# Patient Record
Sex: Female | Born: 1940 | Race: Black or African American | Hispanic: No | Smoking: Never smoker
Health system: Southern US, Community
[De-identification: ages and names within clinical notes are randomized; demographics above are authoritative.]

## PROBLEM LIST (undated history)

## (undated) DIAGNOSIS — I10 Essential (primary) hypertension: Secondary | ICD-10-CM

## (undated) DIAGNOSIS — F329 Major depressive disorder, single episode, unspecified: Secondary | ICD-10-CM

## (undated) DIAGNOSIS — F32A Depression, unspecified: Secondary | ICD-10-CM

---

## 2016-10-22 ENCOUNTER — Emergency Department (HOSPITAL_COMMUNITY): Payer: Medicare Other

## 2016-10-22 ENCOUNTER — Encounter (HOSPITAL_COMMUNITY): Payer: Self-pay | Admitting: Emergency Medicine

## 2016-10-22 ENCOUNTER — Emergency Department (HOSPITAL_COMMUNITY)
Admission: EM | Admit: 2016-10-22 | Discharge: 2016-10-22 | Disposition: A | Discharge status: Home / Self Care | Payer: Medicare Other | Attending: Emergency Medicine | Admitting: Emergency Medicine

## 2016-10-22 DIAGNOSIS — M545 Low back pain, unspecified: Secondary | ICD-10-CM

## 2016-10-22 DIAGNOSIS — M549 Dorsalgia, unspecified: Secondary | ICD-10-CM

## 2016-10-22 DIAGNOSIS — I10 Essential (primary) hypertension: Secondary | ICD-10-CM | POA: Diagnosis not present

## 2016-10-22 HISTORY — DX: Depression, unspecified: F32.A

## 2016-10-22 HISTORY — DX: Major depressive disorder, single episode, unspecified: F32.9

## 2016-10-22 HISTORY — DX: Essential (primary) hypertension: I10

## 2016-10-22 MED ORDER — KETOROLAC TROMETHAMINE 60 MG/2ML IM SOLN
15.0000 mg | Freq: Once | INTRAMUSCULAR | Status: DC
  Filled 2016-10-22: qty 2

## 2016-10-22 MED ORDER — DIAZEPAM 2 MG PO TABS
2.0000 mg | ORAL_TABLET | Freq: Once | ORAL | Status: DC
  Filled 2016-10-22: qty 1

## 2016-10-22 MED ORDER — ACETAMINOPHEN 500 MG PO TABS
1000.0000 mg | ORAL_TABLET | Freq: Once | ORAL | Status: AC
  Administered 2016-10-22: 1000 mg via ORAL
  Filled 2016-10-22: qty 2

## 2016-10-22 MED ORDER — OXYCODONE HCL 5 MG PO TABS
2.5000 mg | ORAL_TABLET | Freq: Once | ORAL | Status: DC
  Filled 2016-10-22: qty 1

## 2016-10-22 NOTE — ED Triage Notes (Signed)
Pt. Stated, I started having lower back pain on Friday  For no reason or Im not sure.

## 2016-10-22 NOTE — Discharge Instructions (Signed)
Take tylenol 1000mg(2 extra strength) four times a day.  ° °

## 2016-10-22 NOTE — ED Notes (Signed)
Placed patient into room patient is resting with family at bedside 

## 2016-10-22 NOTE — ED Provider Notes (Signed)
[facility]-EMERGENCY DEPT Provider Note   CSN: [id number] Arrival date & time: 10/22/16  1131     History   Chief Complaint Chief Complaint  Patient presents with  . Back Pain    HPI Amanda Cooke is a 76 y.o. female.  76 yo F with a cc of R sided low back pain. This been going on for the past 4 days. She denies any injury. She has been taking care of a toddler. Denies radiation of the pain. Denies dysuria or increased frequency or hesitancy. Denies blood in her urine. Denies pain like this previously. Denies history of cancer. Denies numbness or weakness to her leg. Denies loss of bowel or bladder. Denies loss of perirectal sensation. Pain is sharp. Worse with movement and palpation.   The history is provided by the patient.  Back Pain   This is a new problem. The current episode started more than 2 days ago. The problem occurs constantly. The problem has been gradually worsening. The pain is associated with no known injury. The pain is present in the lumbar spine. The quality of the pain is described as stabbing and shooting. The pain does not radiate. The pain is at a severity of 9/10. The pain is moderate. Pertinent negatives include no chest pain, no fever, no headaches and no dysuria. She has tried nothing for the symptoms.    Past Medical History:  Diagnosis Date  . Depression   . Hypertension     There are no active problems to display for this patient.   History reviewed. No pertinent surgical history.  OB History    No data available       Home Medications    Prior to Admission medications   Not on File    Family History No family history on file.  Social History Social History  Substance Use Topics  . Smoking status: Never Smoker  . Smokeless tobacco: Never Used  . Alcohol use No     Allergies   Patient has no allergy information on record.   Review of Systems Review of Systems  Constitutional: Negative for chills and fever.  HENT: Negative  for congestion and rhinorrhea.   Eyes: Negative for redness and visual disturbance.  Respiratory: Negative for shortness of breath and wheezing.   Cardiovascular: Negative for chest pain and palpitations.  Gastrointestinal: Negative for nausea and vomiting.  Genitourinary: Negative for dysuria and urgency.  Musculoskeletal: Positive for back pain. Negative for arthralgias and myalgias.  Skin: Negative for pallor and wound.  Neurological: Negative for dizziness and headaches.     Physical Exam Updated Vital Signs BP (!) 145/66 (BP Location: Right Arm)   Pulse (!) 52   Temp 98.7 F (37.1 C) (Oral)   Resp 16   Ht 5\' 11"  (1.803 m)   Wt 83.5 kg (184 lb)   SpO2 99%   BMI 25.66 kg/m   Physical Exam  Constitutional: She is oriented to person, place, and time. She appears well-developed and well-nourished. No distress.  HENT:  Head: Normocephalic and atraumatic.  Eyes: Pupils are equal, round, and reactive to light. EOM are normal.  Neck: Normal range of motion. Neck supple.  Cardiovascular: Normal rate and regular rhythm.  Exam reveals no gallop and no friction rub.   No murmur heard. Pulmonary/Chest: Effort normal. She has no wheezes. She has no rales.  Abdominal: Soft. She exhibits no distension and no mass. There is no tenderness. There is no guarding.  Musculoskeletal: She exhibits no edema  or tenderness.  Neurological: She is alert and oriented to person, place, and time.  Skin: Skin is warm and dry. She is not diaphoretic.  Psychiatric: She has a normal mood and affect. Her behavior is normal.  Nursing note and vitals reviewed.    ED Treatments / Results  Labs (all labs ordered are listed, but only abnormal results are displayed) Labs Reviewed - No data to display  EKG  EKG Interpretation None       Radiology Ct L-spine No Charge  Result Date: 10/22/2016 CLINICAL DATA:  Right-sided low back pain beginning 3 days ago. EXAM: CT LUMBAR SPINE WITHOUT CONTRAST  TECHNIQUE: Multidetector CT imaging of the lumbar spine was performed without intravenous contrast administration. Multiplanar CT image reconstructions were also generated. COMPARISON:  None. FINDINGS: Segmentation: Normal. Alignment: Trace retrolisthesis of L2 on L3. Mild straightening of the normal lumbar lordosis. Vertebrae: Preserved vertebral body heights without evidence of fracture or destructive osseous process. Paraspinal and other soft tissues: Abdominal aortic atherosclerosis and colonic diverticulosis. Disc levels: There is advanced disc degeneration at L1-2 with complete disc space height loss and marked degenerative endplate sclerosis and irregularity with mild vacuum disc phenomenon. A partially calcified central disc extrusion at L1-2 results in at least moderate spinal stenosis, with disc bulging and facet arthrosis resulting in mild left greater than right neural foraminal stenosis. Milder disc degeneration is present throughout the remainder of the lumbar spine with disc bulging and posterior element hypertrophy resulting in likely mild spinal stenosis at L2-3, L3-4, and L4-5. There is mild neural foraminal stenosis at these levels, greater on the left. Limited visualization of the lower thoracic spine demonstrates advanced facet arthrosis at T11-12 and T12-L1. IMPRESSION: 1. Severe L1-2 disc degeneration with moderate spinal stenosis. 2. Mild multilevel spinal and neural foraminal stenosis elsewhere as above. Electronically Signed   By: Sebastian AcheAllen  Grady M.D.   On: 10/22/2016 14:55   Ct Renal Stone Study  Result Date: 10/22/2016 CLINICAL DATA:  Right-sided low back pain beginning 3 days ago. EXAM: CT ABDOMEN AND PELVIS WITHOUT CONTRAST TECHNIQUE: Multidetector CT imaging of the abdomen and pelvis was performed following the standard protocol without IV contrast. COMPARISON:  None. FINDINGS: Lower chest: Minimal dependent atelectasis in the lung bases. Hepatobiliary: No focal liver abnormality is  seen. There is a small amount of dependent hyperattenuating material in the gallbladder which may reflect tiny stones. No pericholecystic inflammation is evident. There is no biliary dilatation. Pancreas: Unremarkable. Spleen: Unremarkable. Adrenals/Urinary Tract: Unremarkable adrenal glands. No evidence renal mass, calculi, or hydronephrosis. Unremarkable bladder. Stomach/Bowel: The stomach is within normal limits. There is no evidence of bowel obstruction. Relatively diffuse colonic diverticulosis is noted without evidence of diverticulitis. The appendix is unremarkable. Vascular/Lymphatic: Tortuous, calcified infrarenal abdominal aorta without aneurysm. No enlarged lymph nodes. Reproductive: Partially calcified posterior uterine mass measuring approximately 4.7 x 3.9 cm. 1.7 cm low-density focus centrally at the level of the lower uterus, cervix may reflect a cyst or small amount of fluid. Unremarkable ovaries. Other: No intraperitoneal free fluid. No abdominal wall mass or hernia. Musculoskeletal: Thoracolumbar spondylosis, reported on separate lumbar spine CT. IMPRESSION: 1. No acute abnormality identified in the abdomen or pelvis. 2. 4.7 cm uterine mass, likely a fibroid. 3. Suspected tiny gallstones. 4. Colonic diverticulosis. 5.  Aortic Atherosclerosis (ICD10-I70.0). Electronically Signed   By: Sebastian AcheAllen  Grady M.D.   On: 10/22/2016 14:49    Procedures Procedures (including critical care time)  Medications Ordered in ED Medications  oxyCODONE (Oxy IR/ROXICODONE) immediate release  tablet 2.5 mg (2.5 mg Oral Refused 10/22/16 1415)  ketorolac (TORADOL) injection 15 mg (15 mg Intramuscular Refused 10/22/16 1415)  diazepam (VALIUM) tablet 2 mg (2 mg Oral Refused 10/22/16 1415)  acetaminophen (TYLENOL) tablet 1,000 mg (1,000 mg Oral Given 10/22/16 1412)     Initial Impression / Assessment and Plan / ED Course  I have reviewed the triage vital signs and the nursing notes.  Pertinent labs & imaging results  that were available during my care of the patient were reviewed by me and considered in my medical decision making (see chart for details).     76 yo F With a chief complaint of right-sided low back pain. There are no red flags except for the patient's age. Will obtain imaging to further evaluate the size of the aorta as well as other possible pathology.  CT negative for AAA or other concerning pathology. She does have a possible uterine fibroid as well as there is some chronic lumbar disease concerning for possible herniation. I discussed results with the patient. Will have her take Tylenol around-the-clock. Follow with her family physician.  4:14 PM:  I have discussed the diagnosis/risks/treatment options with the patient and family and believe the pt to be eligible for discharge home to follow-up with PCP. We also discussed returning to the ED immediately if new or worsening sx occur. We discussed the sx which are most concerning (e.g., sudden worsening pain, fever, inability to tolerate by mouth) that necessitate immediate return. Medications administered to the patient during their visit and any new prescriptions provided to the patient are listed below.  Medications given during this visit Medications  oxyCODONE (Oxy IR/ROXICODONE) immediate release tablet 2.5 mg (2.5 mg Oral Refused 10/22/16 1415)  ketorolac (TORADOL) injection 15 mg (15 mg Intramuscular Refused 10/22/16 1415)  diazepam (VALIUM) tablet 2 mg (2 mg Oral Refused 10/22/16 1415)  acetaminophen (TYLENOL) tablet 1,000 mg (1,000 mg Oral Given 10/22/16 1412)     The patient appears reasonably screen and/or stabilized for discharge and I doubt any other medical condition or other [facility] requiring further screening, evaluation, or treatment in the ED at this time prior to discharge.    Final Clinical Impressions(s) / ED Diagnoses   Final diagnoses:  Acute right-sided low back pain without sciatica    New Prescriptions There are no  discharge medications for this patient.    Melene PlanFloyd, Selby Foisy, DO 10/22/16 1614

## 2016-10-22 NOTE — ED Notes (Signed)
PT refused all medications other than tylenol. Then when taking tylenol refused to take both tablets and only took 500mg 

## 2019-02-19 IMAGING — CT CT RENAL STONE PROTOCOL
2 of 4 series · 15 of 46 positions shown, 17 images · non-contrast
Comparison: None.

CLINICAL DATA: Right-sided low back pain beginning 3 days ago.

EXAM:
CT ABDOMEN AND PELVIS WITHOUT CONTRAST
TECHNIQUE: Multidetector CT imaging of the abdomen and pelvis was performed
following the standard protocol without IV contrast.

[Series 3: stone study 5.0 i30f 2 · axial · 0.68mm/px · z∈[+1068,+1473]mm · 12 of 93 slices shown, 14 images]
[im 8/93  soft-tissue]
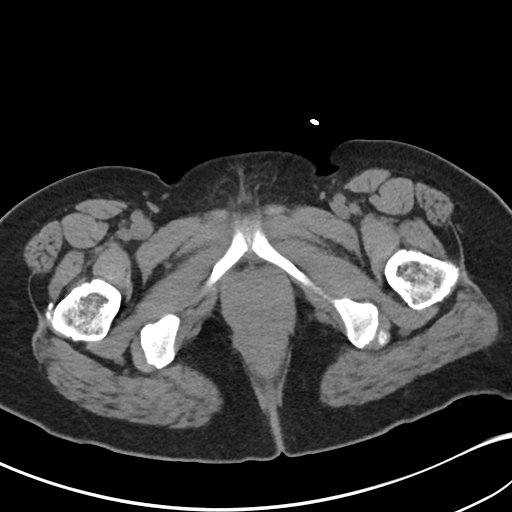
[im 8/93  bone]
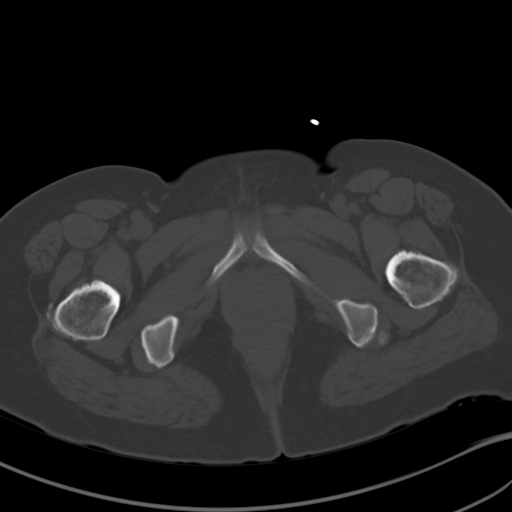
[im 15/93  soft-tissue]
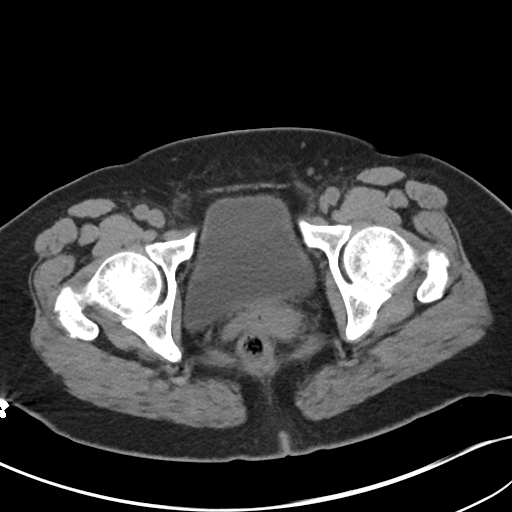
[im 23/93  soft-tissue]
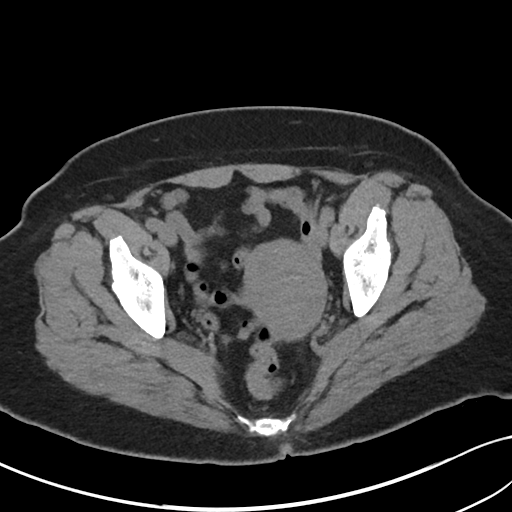
[im 30/93  soft-tissue]
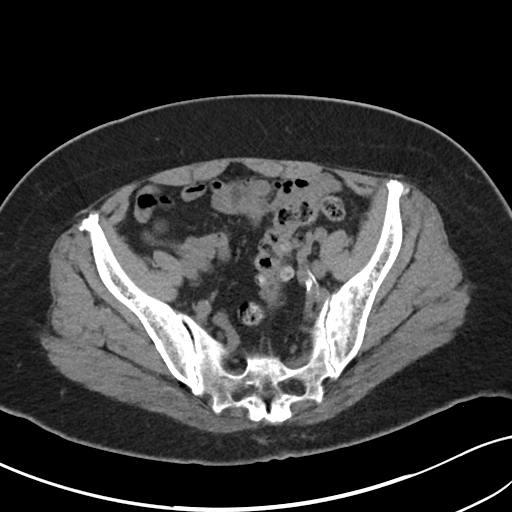
[im 37/93  soft-tissue]
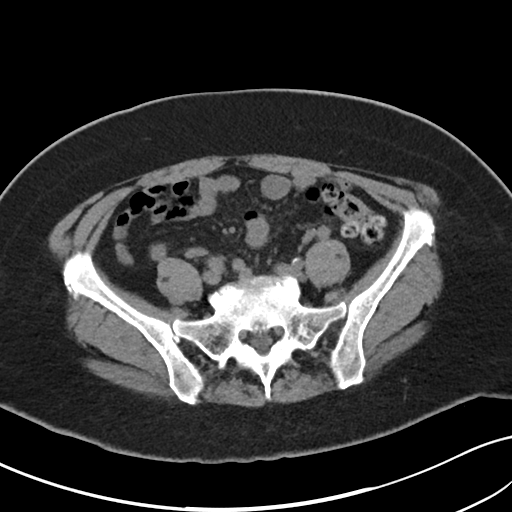
[im 45/93  soft-tissue]
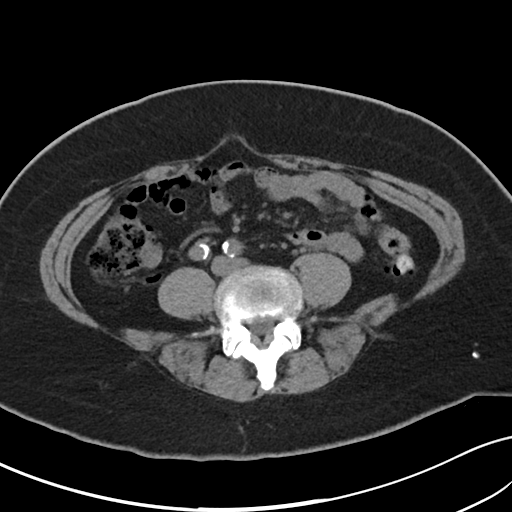
[im 52/93  soft-tissue]
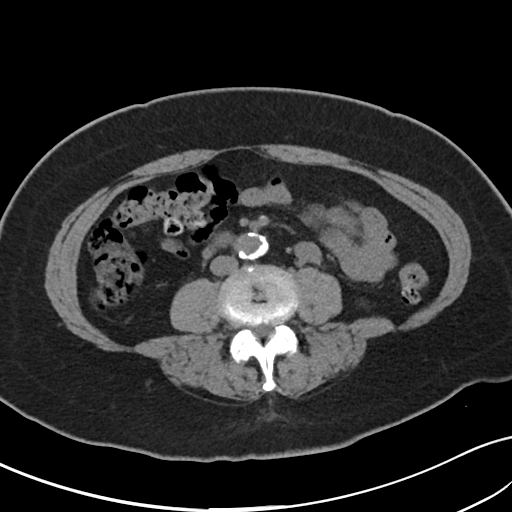
[im 59/93  soft-tissue]
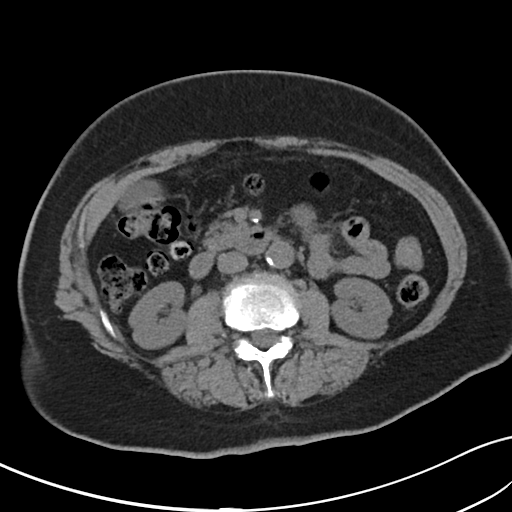
[im 67/93  soft-tissue]
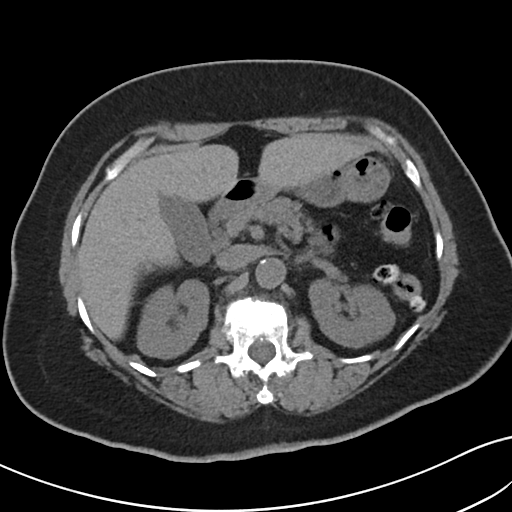
[im 67/93  bone]
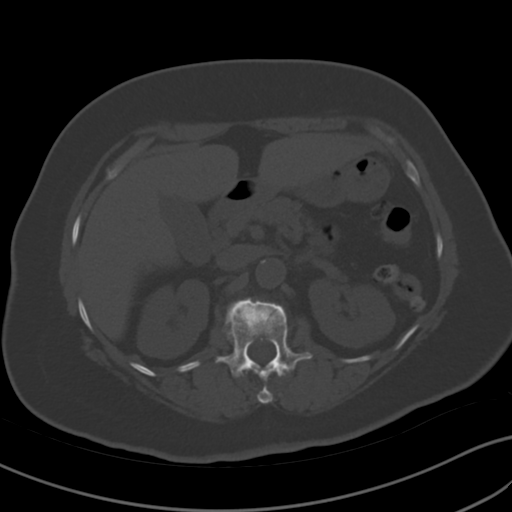
[im 74/93  soft-tissue]
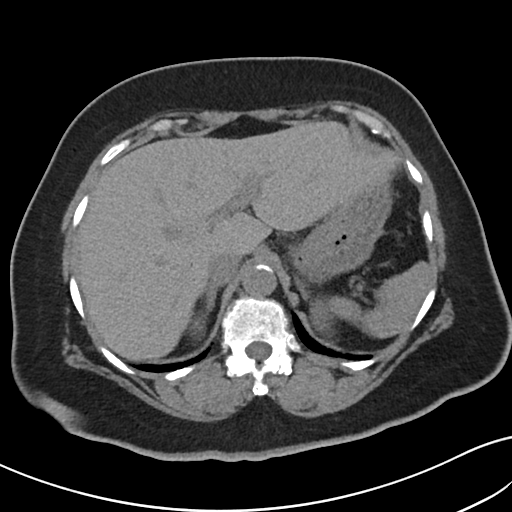
[im 81/93  soft-tissue]
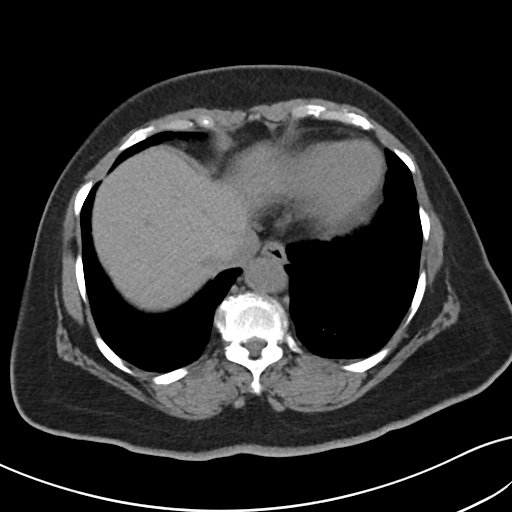
[im 89/93  soft-tissue]
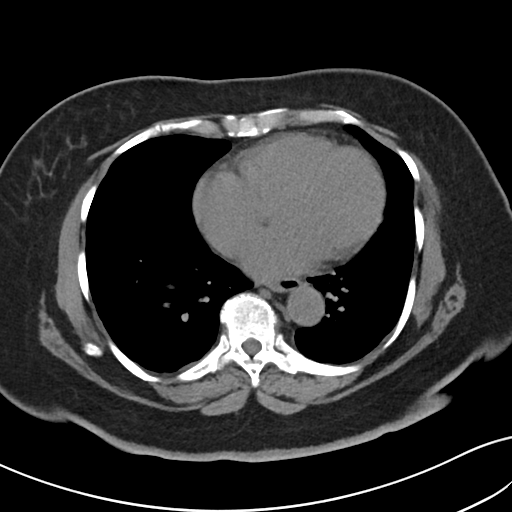

[Series 6: coronal soft tissue · coronal · 0.62mm/px · 3 of 101 slices shown]
[im 34/101  soft-tissue]
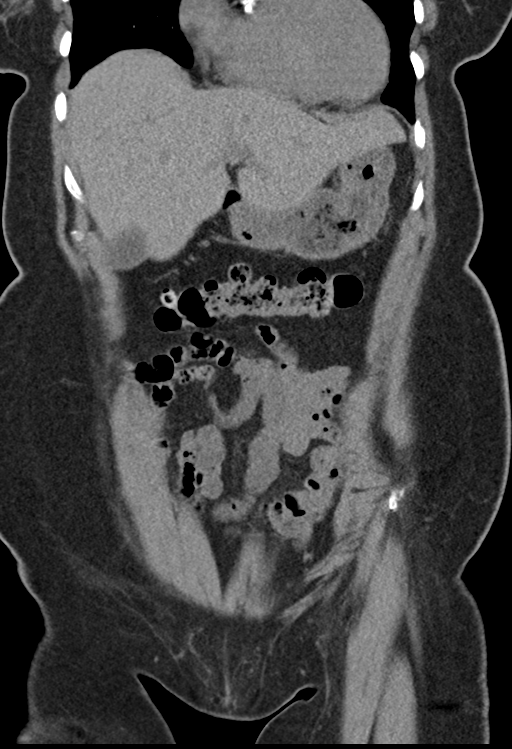
[im 45/101  soft-tissue]
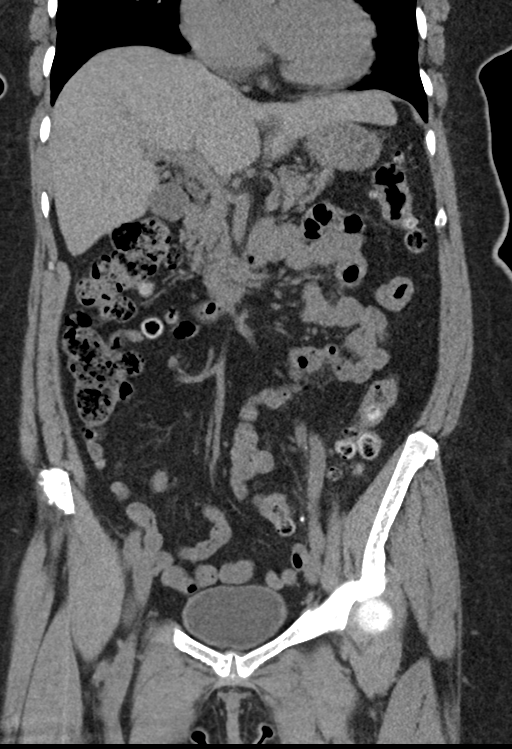
[im 56/101  soft-tissue]
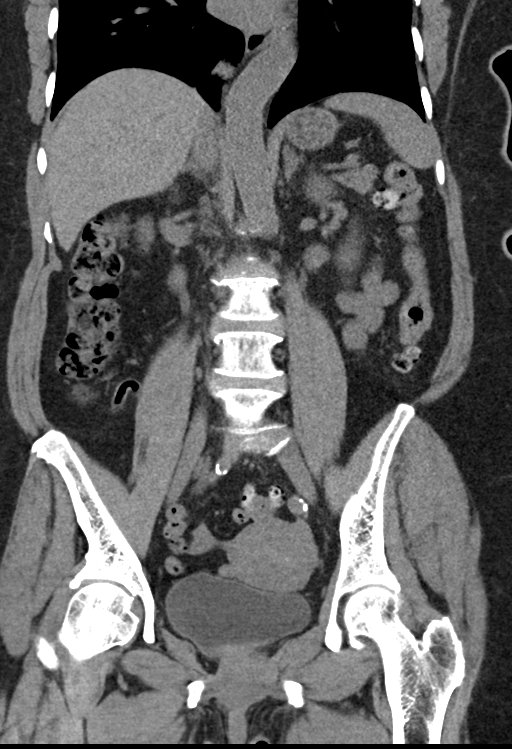

[15 of 46 positions shown; findings below may reference images not displayed]

FINDINGS: Lower chest: Minimal dependent atelectasis in the lung bases.

Hepatobiliary: No focal liver abnormality is seen. There is a small
amount of dependent hyperattenuating material in the gallbladder
which may reflect tiny stones. No pericholecystic inflammation is
evident. There is no biliary dilatation.

Pancreas: Unremarkable.

Spleen: Unremarkable.

Adrenals/Urinary Tract: Unremarkable adrenal glands. No evidence
renal mass, calculi, or hydronephrosis. Unremarkable bladder.

Stomach/Bowel: The stomach is within normal limits. There is no
evidence of bowel obstruction. Relatively diffuse colonic
diverticulosis is noted without evidence of diverticulitis. The
appendix is unremarkable.

Vascular/Lymphatic: Tortuous, calcified infrarenal abdominal aorta
without aneurysm. No enlarged lymph nodes.

Reproductive: Partially calcified posterior uterine mass measuring
approximately 4.7 x 3.9 cm. 1.7 cm low-density focus centrally at
the level of the lower uterus, cervix may reflect a cyst or small
amount of fluid. Unremarkable ovaries.

Other: No intraperitoneal free fluid. No abdominal wall mass or
hernia.

Musculoskeletal: Thoracolumbar spondylosis, reported on separate
lumbar spine CT.
IMPRESSION: 1. No acute abnormality identified in the abdomen or pelvis.
[DATE] cm uterine mass, likely a fibroid.
3. Suspected tiny gallstones.
4. Colonic diverticulosis.
5.  Aortic Atherosclerosis ([id number]).

## 2019-02-19 IMAGING — CT CT L SPINE W/O CM
3 series · 13 of 33 positions shown, 16 images · non-contrast
Comparison: None.

CLINICAL DATA: Right-sided low back pain beginning 3 days ago.

EXAM:
CT LUMBAR SPINE WITHOUT CONTRAST
TECHNIQUE: Multidetector CT imaging of the lumbar spine was performed without
intravenous contrast administration. Multiplanar CT image
reconstructions were also generated.

[Series 503: l spine st 2.0 · axial · 0.35mm/px · z∈[+1198,+1370]mm · 5 of 124 slices shown, 7 images]
[im 19/124  soft-tissue]
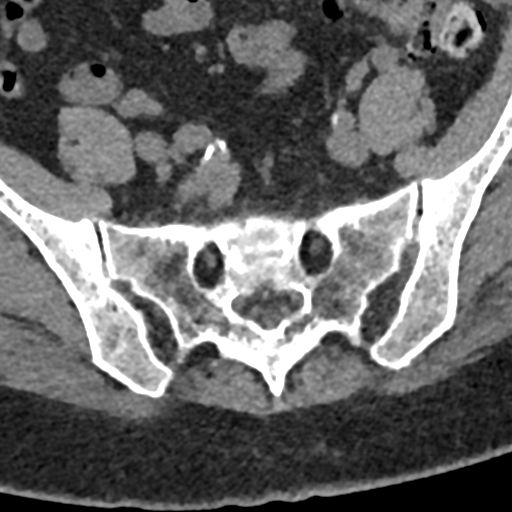
[im 19/124  bone]
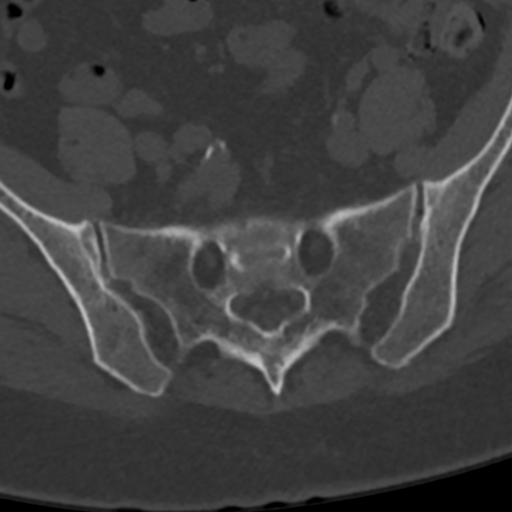
[im 38/124  bone]
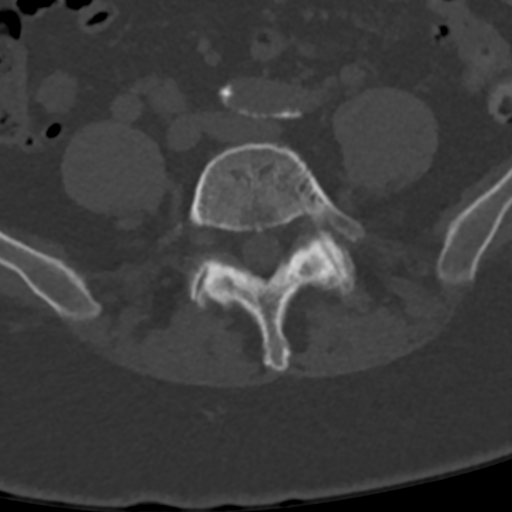
[im 67/124  bone]
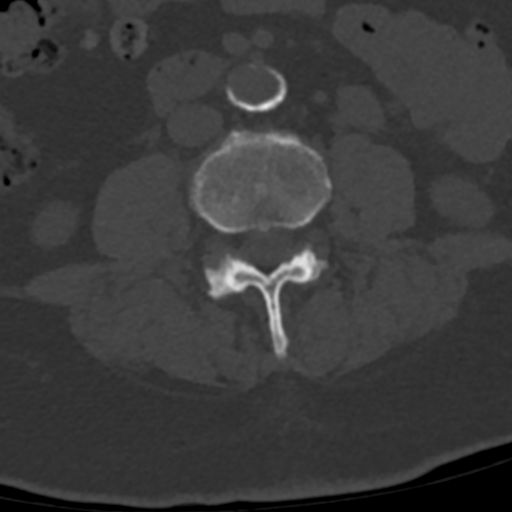
[im 86/124  bone]
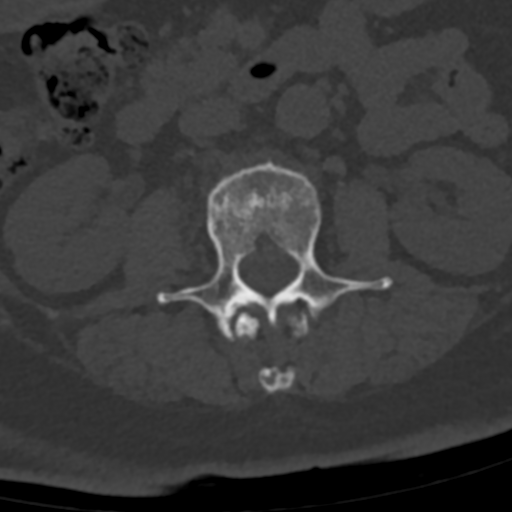
[im 105/124  soft-tissue]
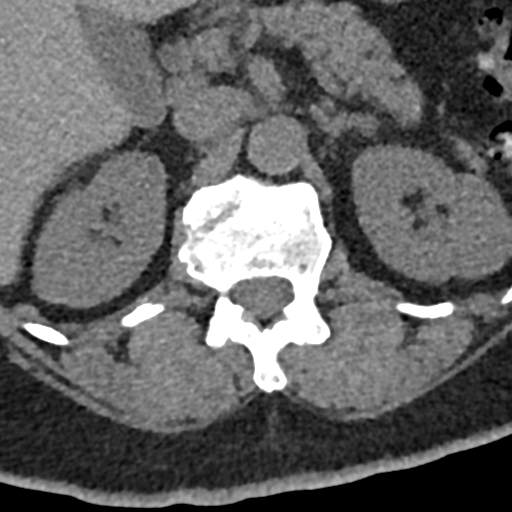
[im 105/124  bone]
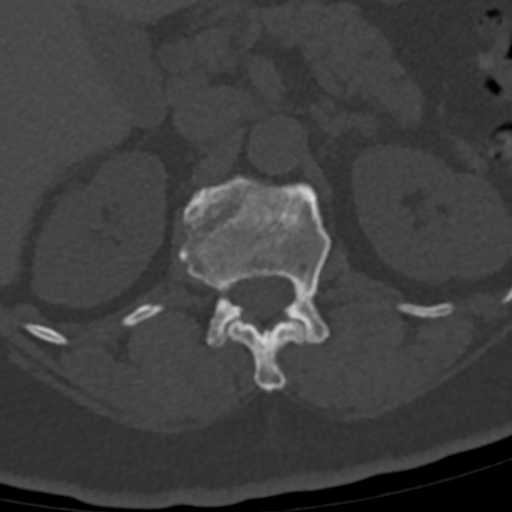

[Series 505: l spine bone 2.cor · coronal · 0.35mm/px · 3 of 90 slices shown]
[im 18/90  bone]
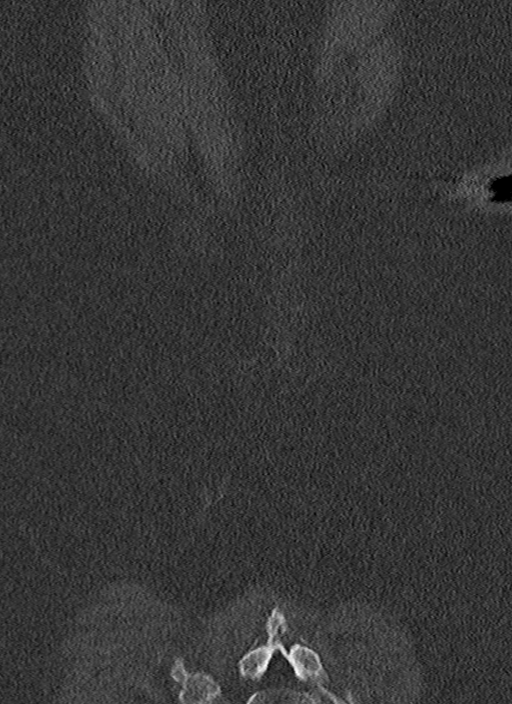
[im 36/90  bone]
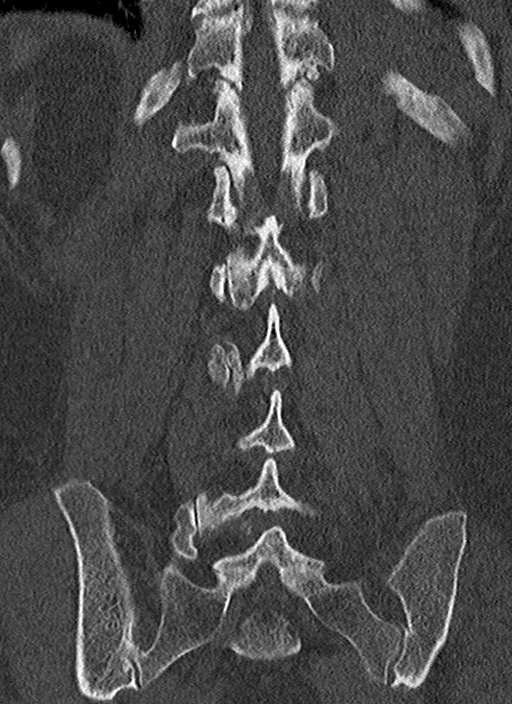
[im 54/90  bone]
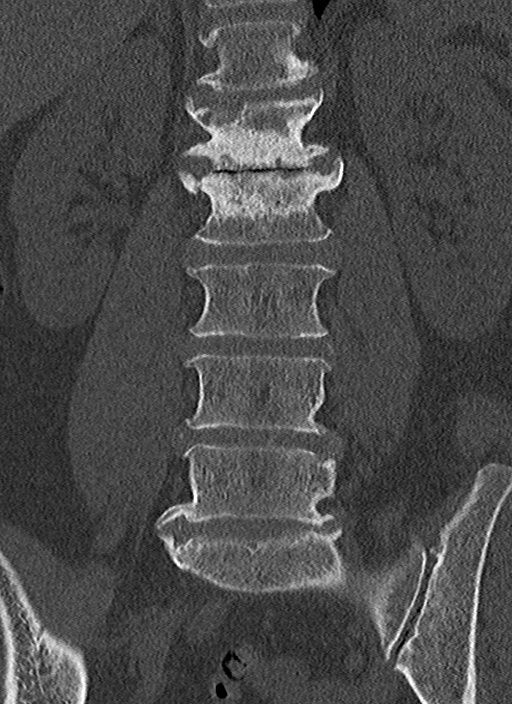

[Series 507: l spine bone 2. sag · sagittal · 0.35mm/px · 5 of 90 slices shown, 6 images]
[im 30/90  bone]
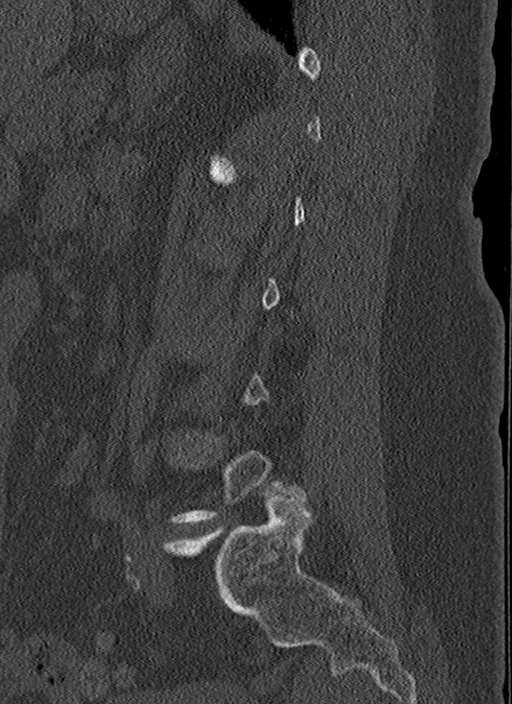
[im 38/90  bone]
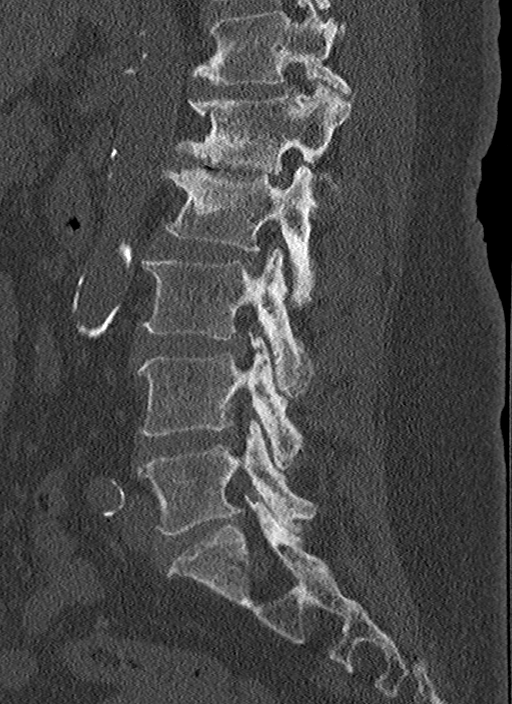
[im 45/90  soft-tissue]
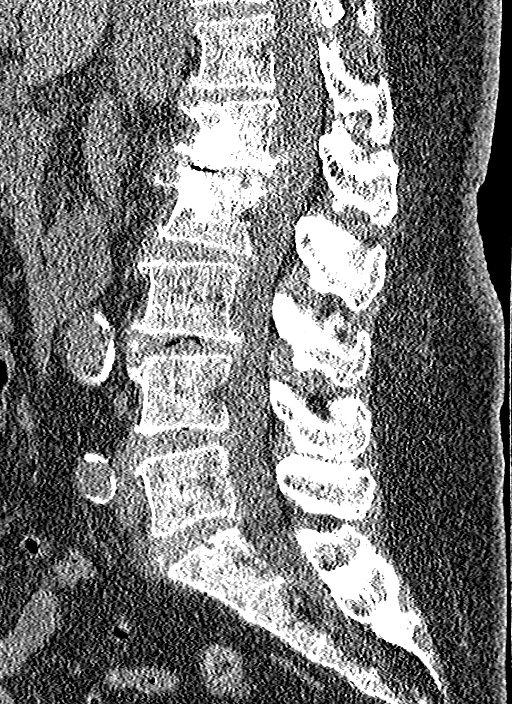
[im 45/90  bone]
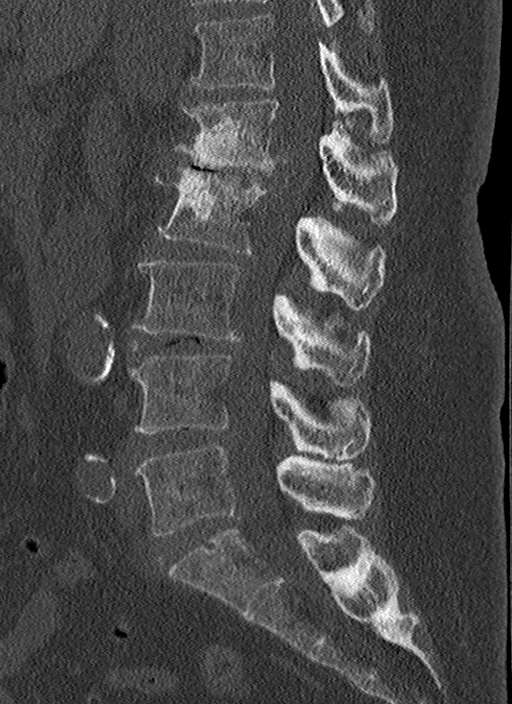
[im 52/90  bone]
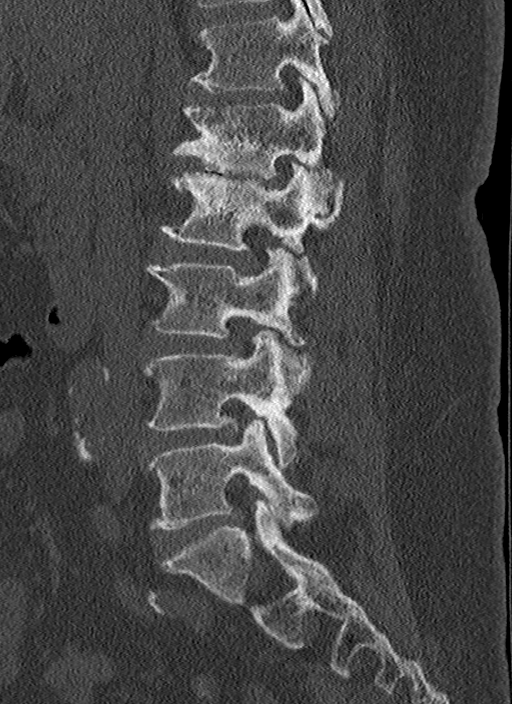
[im 60/90  bone]
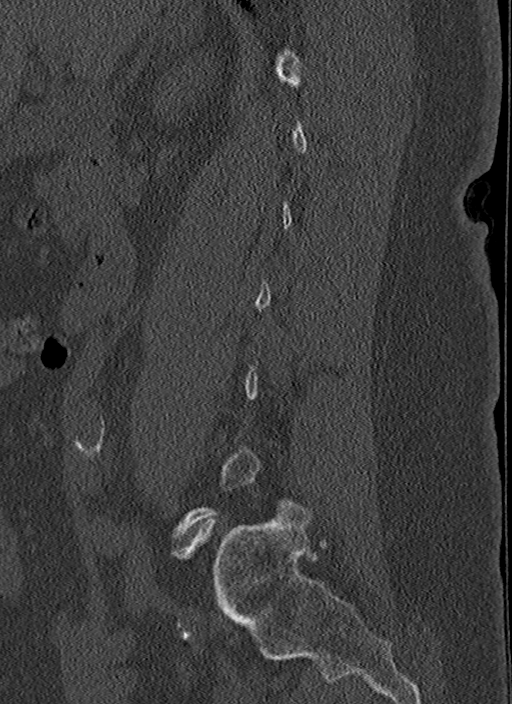

[13 of 33 positions shown; findings below may reference images not displayed]

FINDINGS: Segmentation: Normal.

Alignment: Trace retrolisthesis of L2 on L3. Mild straightening of
the normal lumbar lordosis.

Vertebrae: Preserved vertebral body heights without evidence of
fracture or destructive osseous process.

Paraspinal and other soft tissues: Abdominal aortic atherosclerosis
and colonic diverticulosis.

Disc levels: There is advanced disc degeneration at L1-2 with
complete disc space height loss and marked degenerative endplate
sclerosis and irregularity with mild vacuum disc phenomenon. A
partially calcified central disc extrusion at L1-2 results in at
least moderate spinal stenosis, with disc bulging and facet
arthrosis resulting in mild left greater than right neural foraminal
stenosis. Milder disc degeneration is present throughout the
remainder of the lumbar spine with disc bulging and posterior
element hypertrophy resulting in likely mild spinal stenosis at
L2-3, L3-4, and L4-5. There is mild neural foraminal stenosis at
these levels, greater on the left. Limited visualization of the
lower thoracic spine demonstrates advanced facet arthrosis at T11-12
and T12-L1.
IMPRESSION: 1. Severe L1-2 disc degeneration with moderate spinal stenosis.
2. Mild multilevel spinal and neural foraminal stenosis elsewhere as
above.
